# Patient Record
Sex: Male | Born: 1965
Health system: Southern US, Community
[De-identification: ages and names within clinical notes are randomized; demographics above are authoritative.]

---

## 2007-12-28 ENCOUNTER — Emergency Department (HOSPITAL_COMMUNITY): Admission: EM | Admit: 2007-12-28 | Discharge: 2007-12-28 | Payer: Self-pay | Admitting: Family Medicine

## 2008-01-01 ENCOUNTER — Emergency Department (HOSPITAL_COMMUNITY): Admission: EM | Admit: 2008-01-01 | Discharge: 2008-01-01 | Payer: Self-pay | Admitting: Family Medicine

## 2009-07-29 ENCOUNTER — Ambulatory Visit (HOSPITAL_COMMUNITY): Admission: RE | Admit: 2009-07-29 | Discharge: 2009-07-29 | Payer: Self-pay | Admitting: Family Medicine

## 2014-09-03 ENCOUNTER — Other Ambulatory Visit: Payer: Self-pay | Admitting: Family Medicine

## 2014-09-03 ENCOUNTER — Ambulatory Visit
Admission: RE | Admit: 2014-09-03 | Discharge: 2014-09-03 | Disposition: A | Discharge status: Home / Self Care | Payer: BC Managed Care – PPO | Source: Ambulatory Visit | Attending: Family Medicine | Admitting: Family Medicine

## 2014-09-03 DIAGNOSIS — M545 Low back pain: Secondary | ICD-10-CM

## 2016-09-28 DIAGNOSIS — Z0000 Encounter for general adult medical examination without abnormal findings: Secondary | ICD-10-CM | POA: Diagnosis not present

## 2016-09-28 DIAGNOSIS — Z13220 Encounter for screening for lipoid disorders: Secondary | ICD-10-CM | POA: Diagnosis not present

## 2017-04-05 DIAGNOSIS — H20011 Primary iridocyclitis, right eye: Secondary | ICD-10-CM | POA: Diagnosis not present

## 2017-04-18 DIAGNOSIS — H209 Unspecified iridocyclitis: Secondary | ICD-10-CM | POA: Diagnosis not present

## 2017-04-18 DIAGNOSIS — Z23 Encounter for immunization: Secondary | ICD-10-CM | POA: Diagnosis not present

## 2017-10-14 ENCOUNTER — Encounter (HOSPITAL_COMMUNITY): Payer: Self-pay | Admitting: Emergency Medicine

## 2017-10-14 ENCOUNTER — Ambulatory Visit (HOSPITAL_COMMUNITY)
Admission: EM | Admit: 2017-10-14 | Discharge: 2017-10-14 | Disposition: A | Discharge status: Home / Self Care | Payer: 59 | Attending: Internal Medicine | Admitting: Internal Medicine

## 2017-10-14 DIAGNOSIS — J039 Acute tonsillitis, unspecified: Secondary | ICD-10-CM | POA: Diagnosis not present

## 2017-10-14 LAB — POCT RAPID STREP A: Streptococcus, Group A Screen (Direct): POSITIVE — AB

## 2017-10-14 MED ORDER — AZITHROMYCIN 250 MG PO TABS: ORAL_TABLET | ORAL | 0 refills | Status: AC

## 2017-10-14 NOTE — ED Provider Notes (Signed)
10/14/2017 8:19 PM   DOB: 1965/11/20 / MRN: [id number]  SUBJECTIVE:  Craig Hurst is a 52 y.o. male presenting for throat pain that started 24 hours ago.  Tells me he had a fever, tactile, all day yesterday and today.  Patient has a history of getting strep throat "every other year."  Denies difficulty with breathing and difficulty with handling his secretions at this time.  He has not tried anything for the pain in his throat.  Denies chest pain or shortness of breath.  He has No Known Allergies.   He  has no past medical history on file.    He  reports that he has never smoked. He has never used smokeless tobacco. He reports that he does not drink alcohol or use drugs. He  has no sexual activity history on file. The patient  has no past surgical history on file.  His family history is not on file.  ROS per HPI  OBJECTIVE:  BP 132/73 (BP Location: Left Arm)   Pulse 68   Temp 98.4 F (36.9 C) (Oral)   Resp 18   SpO2 97%   Physical Exam  Constitutional: He is oriented to person, place, and time. He appears well-developed and well-nourished.  Non-toxic appearance. He does not appear ill. No distress.  HENT:  Mouth/Throat: No oral lesions. No uvula swelling. Oropharyngeal exudate and posterior oropharyngeal erythema present. No posterior oropharyngeal edema or tonsillar abscesses.  Eyes: Pupils are equal, round, and reactive to light. Conjunctivae and EOM are normal.  Cardiovascular: Normal rate, regular rhythm, S1 normal, S2 normal, normal heart sounds, intact distal pulses and normal pulses. Exam reveals no gallop and no friction rub.  No murmur heard. Pulmonary/Chest: Effort normal. No stridor. No respiratory distress. He has no wheezes. He has no rales.  Abdominal: He exhibits no distension.  Musculoskeletal: Normal range of motion. He exhibits no edema.  Lymphadenopathy:       Head (right side): Tonsillar adenopathy present.       Head (left side): Tonsillar adenopathy present.   Neurological: He is alert and oriented to person, place, and time. No cranial nerve deficit. Coordination normal.  Skin: Skin is warm and dry. He is not diaphoretic.  Psychiatric: He has a normal mood and affect.  Nursing note and vitals reviewed.   No results found for this or any previous visit (from the past 72 hour(s)).  No results found.  ASSESSMENT AND PLAN:  No orders of the defined types were placed in this encounter.    Exudative tonsillitis: Patient with history of repeat strep throat infections.  He has exudate on his left tonsil along with lymphadenopathy about the tonsillar lymph nodes.  He has no cough or nasal congestion.  Requesting something other than amoxicillin tonight as this causes him GI distress historically.       The patient is advised to call or return to clinic if he does not see an improvement in symptoms, or to seek the care of the closest emergency department if he worsens with the above plan.   Deliah Boston, MHS, PA-C 10/14/2017 8:19 PM    Ofilia Neas, PA-C 10/14/17 2020

## 2017-10-14 NOTE — ED Triage Notes (Signed)
Pt here for sore throat

## 2018-03-19 DIAGNOSIS — Z23 Encounter for immunization: Secondary | ICD-10-CM | POA: Diagnosis not present

## 2018-03-19 DIAGNOSIS — Z0000 Encounter for general adult medical examination without abnormal findings: Secondary | ICD-10-CM | POA: Diagnosis not present

## 2018-03-19 DIAGNOSIS — Z13220 Encounter for screening for lipoid disorders: Secondary | ICD-10-CM | POA: Diagnosis not present

## 2018-05-29 DIAGNOSIS — R361 Hematospermia: Secondary | ICD-10-CM | POA: Diagnosis not present

## 2019-02-11 DIAGNOSIS — F411 Generalized anxiety disorder: Secondary | ICD-10-CM | POA: Diagnosis not present

## 2019-03-29 DIAGNOSIS — F411 Generalized anxiety disorder: Secondary | ICD-10-CM | POA: Diagnosis not present

## 2019-03-29 DIAGNOSIS — G4700 Insomnia, unspecified: Secondary | ICD-10-CM | POA: Diagnosis not present

## 2019-06-05 DIAGNOSIS — Z0000 Encounter for general adult medical examination without abnormal findings: Secondary | ICD-10-CM | POA: Diagnosis not present

## 2019-06-10 DIAGNOSIS — Z23 Encounter for immunization: Secondary | ICD-10-CM | POA: Diagnosis not present

## 2019-06-10 DIAGNOSIS — Z125 Encounter for screening for malignant neoplasm of prostate: Secondary | ICD-10-CM | POA: Diagnosis not present

## 2019-06-10 DIAGNOSIS — Z13220 Encounter for screening for lipoid disorders: Secondary | ICD-10-CM | POA: Diagnosis not present

## 2019-06-10 DIAGNOSIS — Z79899 Other long term (current) drug therapy: Secondary | ICD-10-CM | POA: Diagnosis not present

## 2020-06-25 DIAGNOSIS — R7301 Impaired fasting glucose: Secondary | ICD-10-CM | POA: Diagnosis not present

## 2020-06-25 DIAGNOSIS — E785 Hyperlipidemia, unspecified: Secondary | ICD-10-CM | POA: Diagnosis not present

## 2020-06-25 DIAGNOSIS — Z0000 Encounter for general adult medical examination without abnormal findings: Secondary | ICD-10-CM | POA: Diagnosis not present

## 2020-09-28 DIAGNOSIS — Z1211 Encounter for screening for malignant neoplasm of colon: Secondary | ICD-10-CM | POA: Diagnosis not present

## 2021-03-04 DIAGNOSIS — R109 Unspecified abdominal pain: Secondary | ICD-10-CM | POA: Diagnosis not present

## 2021-03-04 DIAGNOSIS — Z23 Encounter for immunization: Secondary | ICD-10-CM | POA: Diagnosis not present

## 2021-03-05 ENCOUNTER — Other Ambulatory Visit: Payer: Self-pay

## 2021-03-05 ENCOUNTER — Other Ambulatory Visit (HOSPITAL_BASED_OUTPATIENT_CLINIC_OR_DEPARTMENT_OTHER): Payer: Self-pay | Admitting: Family Medicine

## 2021-03-05 ENCOUNTER — Ambulatory Visit (HOSPITAL_BASED_OUTPATIENT_CLINIC_OR_DEPARTMENT_OTHER): Payer: BC Managed Care – PPO

## 2021-03-05 ENCOUNTER — Ambulatory Visit (HOSPITAL_BASED_OUTPATIENT_CLINIC_OR_DEPARTMENT_OTHER)
Admission: RE | Admit: 2021-03-05 | Discharge: 2021-03-05 | Disposition: A | Discharge status: Home / Self Care | Payer: BC Managed Care – PPO | Source: Ambulatory Visit | Attending: Family Medicine | Admitting: Family Medicine

## 2021-03-05 DIAGNOSIS — R109 Unspecified abdominal pain: Secondary | ICD-10-CM | POA: Insufficient documentation

## 2021-03-05 MED ORDER — IOHEXOL 300 MG/ML  SOLN
100.0000 mL | Freq: Once | INTRAMUSCULAR | Status: AC | PRN
  Administered 2021-03-05: 100 mL via INTRAVENOUS

## 2021-03-08 ENCOUNTER — Ambulatory Visit (HOSPITAL_BASED_OUTPATIENT_CLINIC_OR_DEPARTMENT_OTHER): Payer: 59

## 2021-07-13 DIAGNOSIS — Z0000 Encounter for general adult medical examination without abnormal findings: Secondary | ICD-10-CM | POA: Diagnosis not present

## 2021-07-13 DIAGNOSIS — R7301 Impaired fasting glucose: Secondary | ICD-10-CM | POA: Diagnosis not present

## 2021-07-13 DIAGNOSIS — E785 Hyperlipidemia, unspecified: Secondary | ICD-10-CM | POA: Diagnosis not present

## 2021-07-13 DIAGNOSIS — Z79899 Other long term (current) drug therapy: Secondary | ICD-10-CM | POA: Diagnosis not present

## 2021-11-15 DIAGNOSIS — Z01818 Encounter for other preprocedural examination: Secondary | ICD-10-CM | POA: Diagnosis not present

## 2022-02-04 DIAGNOSIS — K649 Unspecified hemorrhoids: Secondary | ICD-10-CM | POA: Diagnosis not present

## 2022-02-04 DIAGNOSIS — D128 Benign neoplasm of rectum: Secondary | ICD-10-CM | POA: Diagnosis not present

## 2022-02-04 DIAGNOSIS — D124 Benign neoplasm of descending colon: Secondary | ICD-10-CM | POA: Diagnosis not present

## 2022-02-04 DIAGNOSIS — Z1211 Encounter for screening for malignant neoplasm of colon: Secondary | ICD-10-CM | POA: Diagnosis not present

## 2022-02-04 DIAGNOSIS — D123 Benign neoplasm of transverse colon: Secondary | ICD-10-CM | POA: Diagnosis not present

## 2022-02-04 DIAGNOSIS — D125 Benign neoplasm of sigmoid colon: Secondary | ICD-10-CM | POA: Diagnosis not present

## 2022-07-26 DIAGNOSIS — E785 Hyperlipidemia, unspecified: Secondary | ICD-10-CM | POA: Diagnosis not present

## 2022-07-26 DIAGNOSIS — Z0000 Encounter for general adult medical examination without abnormal findings: Secondary | ICD-10-CM | POA: Diagnosis not present

## 2022-07-26 DIAGNOSIS — Z125 Encounter for screening for malignant neoplasm of prostate: Secondary | ICD-10-CM | POA: Diagnosis not present

## 2022-07-26 DIAGNOSIS — R7301 Impaired fasting glucose: Secondary | ICD-10-CM | POA: Diagnosis not present

## 2022-07-26 DIAGNOSIS — Z79899 Other long term (current) drug therapy: Secondary | ICD-10-CM | POA: Diagnosis not present

## 2022-09-05 DIAGNOSIS — F4322 Adjustment disorder with anxiety: Secondary | ICD-10-CM | POA: Diagnosis not present

## 2022-10-01 IMAGING — CT CT ABD-PELV W/ CM
2 of 5 series · 17 of 46 positions shown, 19 images · IV contrast (omnipaque)
Comparison: None.

CLINICAL DATA: Abdominal pain.  Constipation.

EXAM:
CT ABDOMEN AND PELVIS WITH CONTRAST
TECHNIQUE: Multidetector CT imaging of the abdomen and pelvis was performed
using the standard protocol following bolus administration of
intravenous contrast.
CONTRAST:  100mL OMNIPAQUE IOHEXOL 300 MG/ML  SOLN

[Series 2: abd pel w · axial · 0.82mm/px · z∈[+534,+904]mm · 14 of 84 slices shown, 16 images]
[im 5/84  soft-tissue]
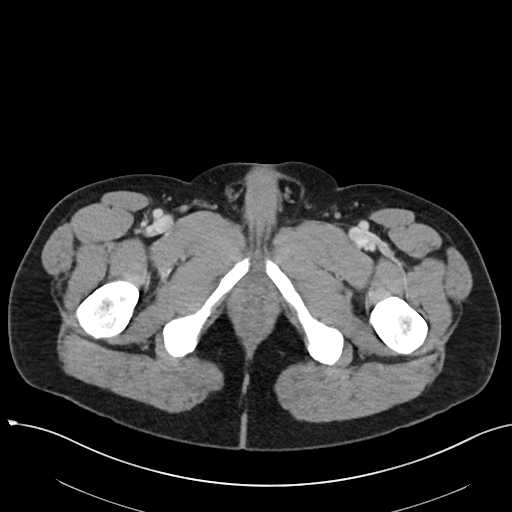
[im 5/84  bone]
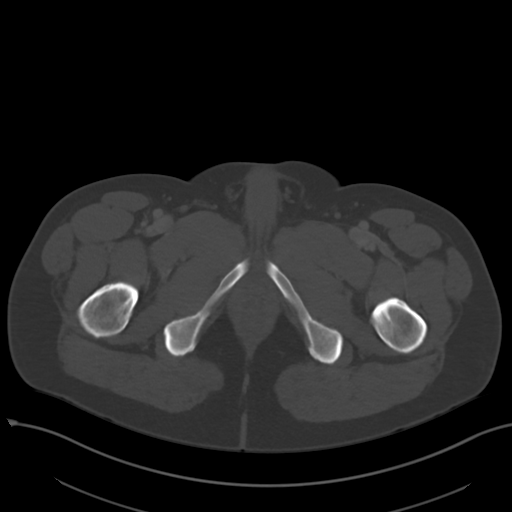
[im 10/84  soft-tissue]
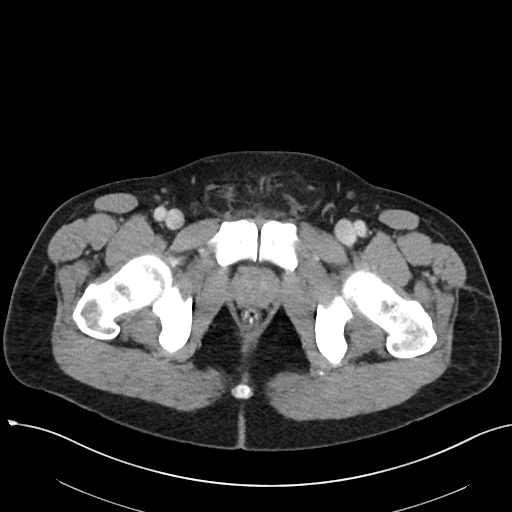
[im 19/84  soft-tissue]
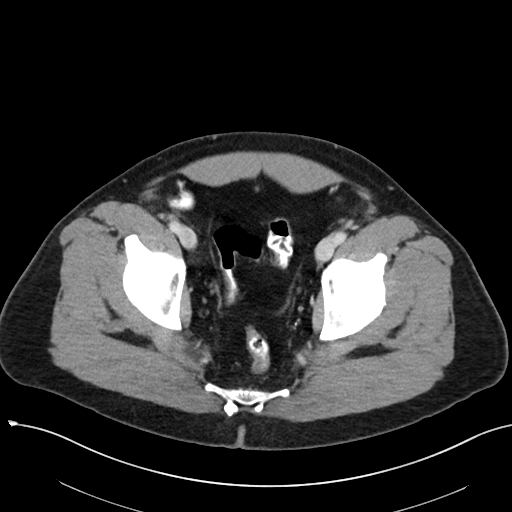
[im 24/84  soft-tissue]
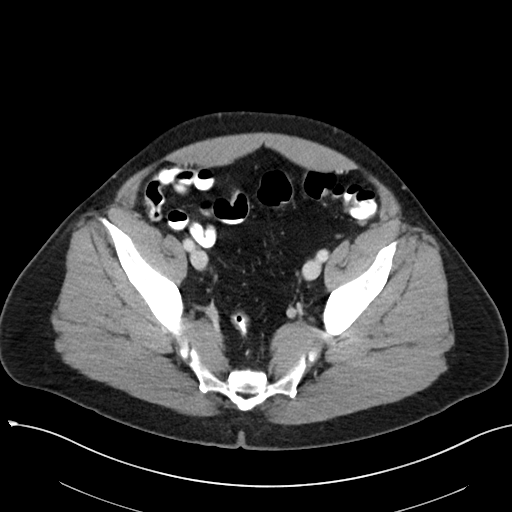
[im 28/84  soft-tissue]
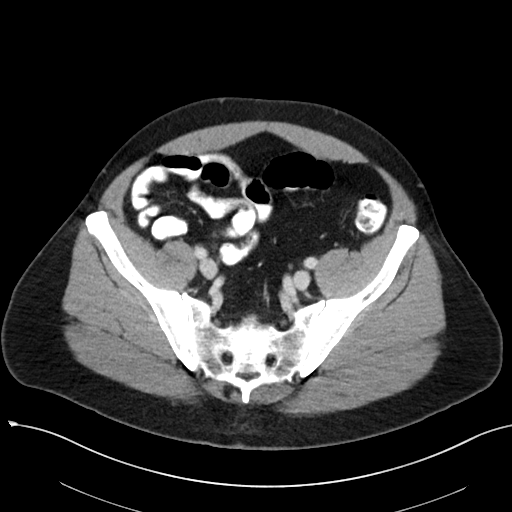
[im 33/84  soft-tissue]
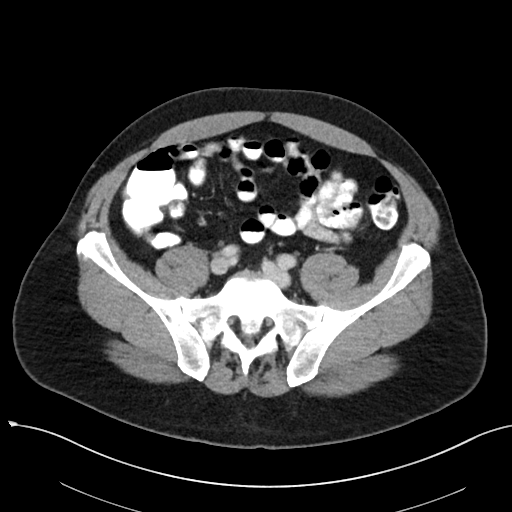
[im 37/84  soft-tissue]
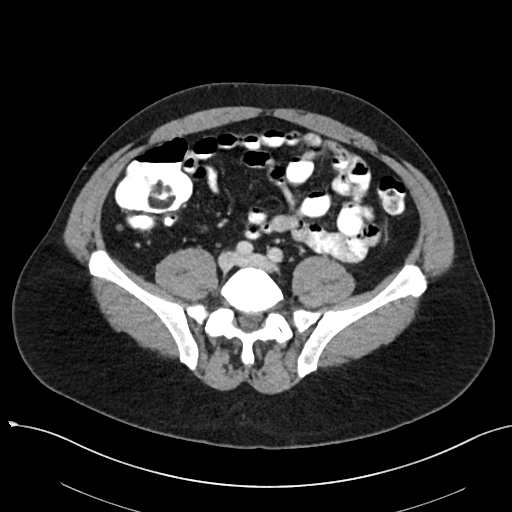
[im 47/84  soft-tissue]
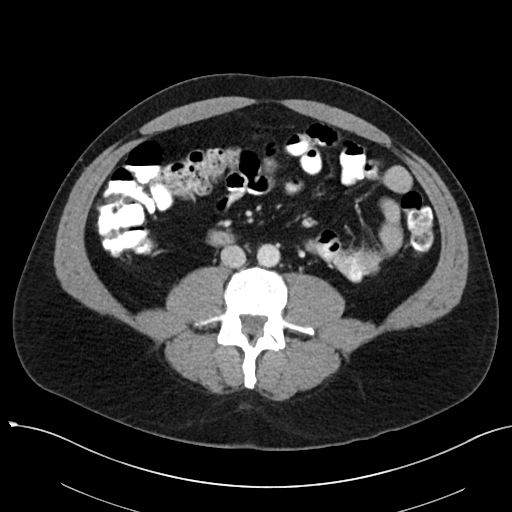
[im 51/84  soft-tissue]
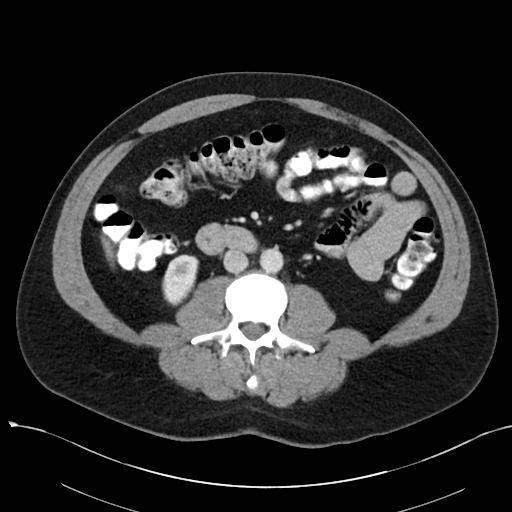
[im 51/84  bone]
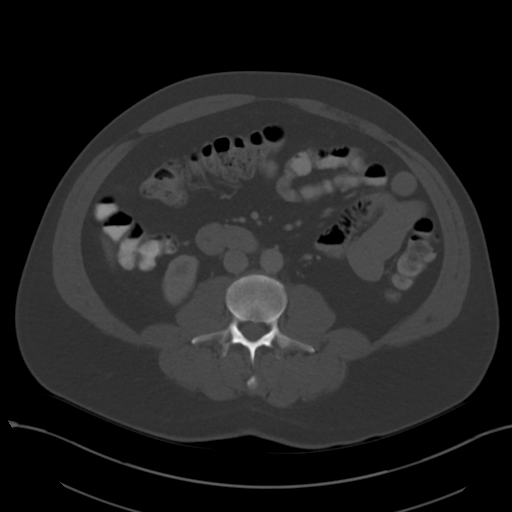
[im 56/84  soft-tissue]
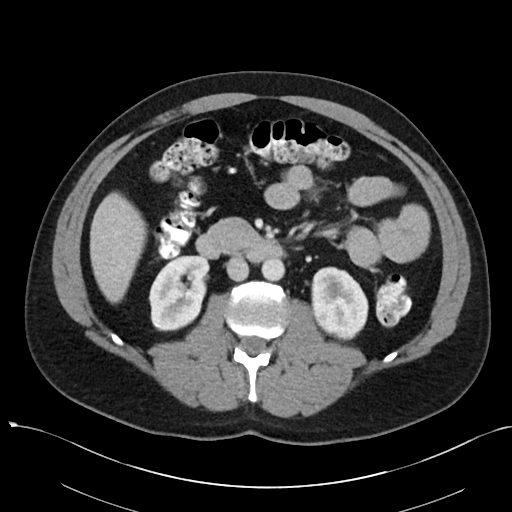
[im 60/84  soft-tissue]
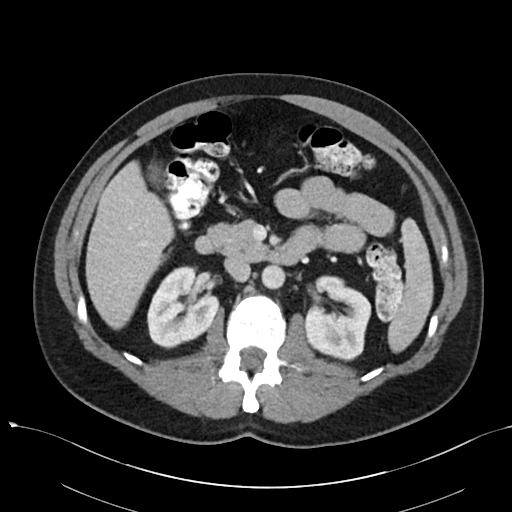
[im 65/84  soft-tissue]
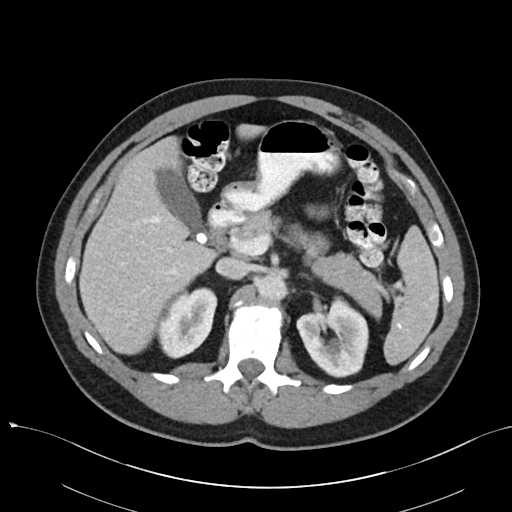
[im 74/84  soft-tissue]
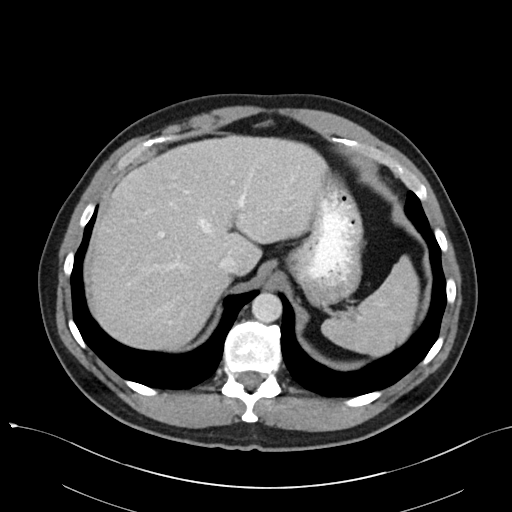
[im 79/84  soft-tissue]
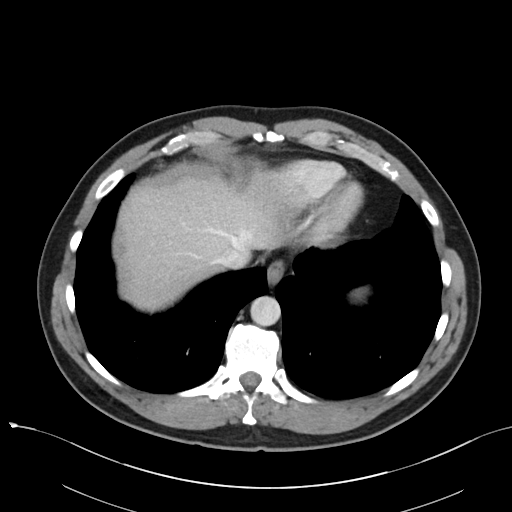

[Series 5: coronal · coronal · 0.82mm/px · 3 of 105 slices shown]
[im 35/105  soft-tissue]
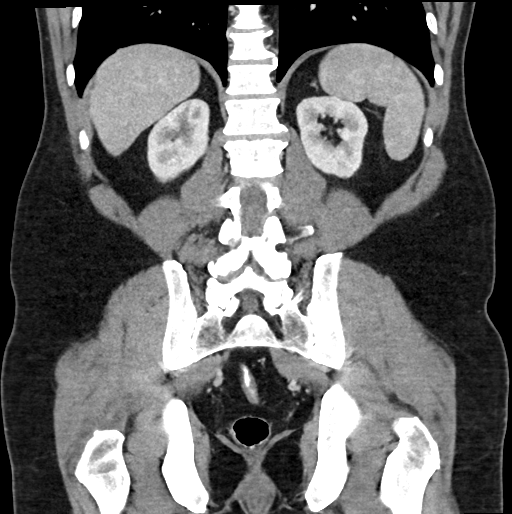
[im 47/105  soft-tissue]
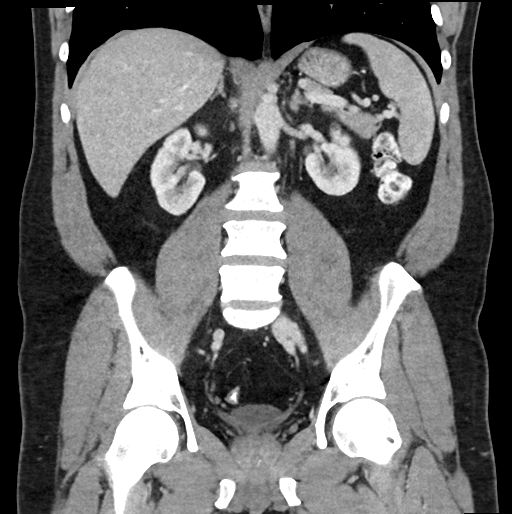
[im 58/105  soft-tissue]
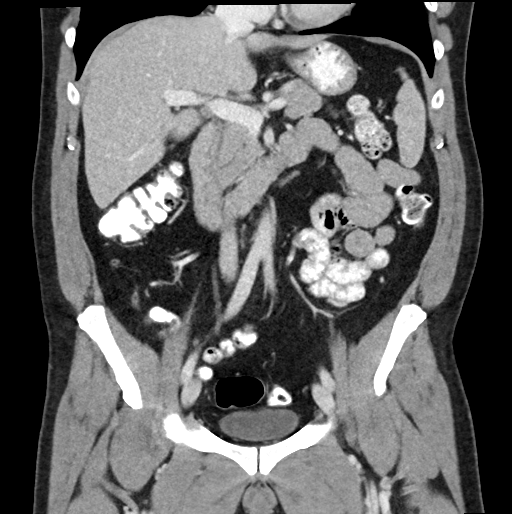

[17 of 46 positions shown; findings below may reference images not displayed]

FINDINGS: Lower chest: Clear lung bases.

Hepatobiliary: Subcentimeter hypodensity in the lateral segment of
the left hepatic lobe, too small to fully characterize. 8 mm
gallstone. No evidence of pericholecystic inflammation or biliary
dilatation.

Pancreas: Unremarkable.

Spleen: Calcified granuloma in the spleen.

Adrenals/Urinary Tract: Unremarkable adrenal glands. Subcentimeter
hypodensity in the left kidney, too small to fully characterize. No
renal calculi or hydronephrosis. Unremarkable bladder.

Stomach/Bowel: The stomach is unremarkable. Oral contrast is present
in nondilated loops of small and large bowel to the level of the
rectum without evidence of obstruction or inflammation. The appendix
is unremarkable.

Vascular/Lymphatic: No significant vascular findings are present. No
enlarged abdominal or pelvic lymph nodes.

Reproductive: Unremarkable prostate.

Other: No ascites or pneumoperitoneum.

Musculoskeletal: Mild lumbar spondylosis. No suspicious osseous
lesion.
IMPRESSION: 1. No acute abnormality identified in the abdomen or pelvis.
2. Cholelithiasis.

## 2023-08-08 DIAGNOSIS — B0089 Other herpesviral infection: Secondary | ICD-10-CM | POA: Diagnosis not present

## 2023-08-08 DIAGNOSIS — Z125 Encounter for screening for malignant neoplasm of prostate: Secondary | ICD-10-CM | POA: Diagnosis not present

## 2023-08-08 DIAGNOSIS — Z13220 Encounter for screening for lipoid disorders: Secondary | ICD-10-CM | POA: Diagnosis not present

## 2023-08-08 DIAGNOSIS — Z0000 Encounter for general adult medical examination without abnormal findings: Secondary | ICD-10-CM | POA: Diagnosis not present

## 2023-08-08 DIAGNOSIS — F411 Generalized anxiety disorder: Secondary | ICD-10-CM | POA: Diagnosis not present

## 2023-10-30 DIAGNOSIS — F4322 Adjustment disorder with anxiety: Secondary | ICD-10-CM | POA: Diagnosis not present

## 2023-11-03 DIAGNOSIS — F4322 Adjustment disorder with anxiety: Secondary | ICD-10-CM | POA: Diagnosis not present

## 2023-11-06 DIAGNOSIS — F4322 Adjustment disorder with anxiety: Secondary | ICD-10-CM | POA: Diagnosis not present

## 2023-11-14 ENCOUNTER — Other Ambulatory Visit: Payer: Self-pay | Admitting: Family Medicine

## 2023-11-14 ENCOUNTER — Ambulatory Visit
Admission: RE | Admit: 2023-11-14 | Discharge: 2023-11-14 | Disposition: A | Discharge status: Home / Self Care | Source: Ambulatory Visit | Attending: Family Medicine | Admitting: Family Medicine

## 2023-11-14 DIAGNOSIS — M545 Low back pain, unspecified: Secondary | ICD-10-CM

## 2023-11-14 DIAGNOSIS — E785 Hyperlipidemia, unspecified: Secondary | ICD-10-CM | POA: Diagnosis not present

## 2023-11-14 DIAGNOSIS — R7301 Impaired fasting glucose: Secondary | ICD-10-CM | POA: Diagnosis not present

## 2023-12-13 DIAGNOSIS — F4322 Adjustment disorder with anxiety: Secondary | ICD-10-CM | POA: Diagnosis not present

## 2023-12-23 DIAGNOSIS — F4322 Adjustment disorder with anxiety: Secondary | ICD-10-CM | POA: Diagnosis not present
